# Patient Record
Sex: Female | Born: 2000 | Marital: Single
Health system: Midwestern US, Community
[De-identification: ages and names within clinical notes are randomized; demographics above are authoritative.]

## PROBLEM LIST (undated history)

## (undated) DIAGNOSIS — F419 Anxiety disorder, unspecified: Secondary | ICD-10-CM

## (undated) DIAGNOSIS — F32A Depression, unspecified: Secondary | ICD-10-CM

## (undated) DIAGNOSIS — R109 Unspecified abdominal pain: Secondary | ICD-10-CM

## (undated) DIAGNOSIS — J45909 Unspecified asthma, uncomplicated: Secondary | ICD-10-CM

## (undated) HISTORY — DX: Unspecified abdominal pain: R10.9

---
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
## 2023-06-11 ENCOUNTER — Other Ambulatory Visit: Payer: Self-pay

## 2023-06-11 ENCOUNTER — Ambulatory Visit
Admission: EM | Admit: 2023-06-11 | Discharge: 2023-06-11 | Disposition: A | Discharge status: Home / Self Care | Payer: Medicaid Other | Attending: Physician Assistant | Admitting: Physician Assistant

## 2023-06-11 DIAGNOSIS — W57XXXA Bitten or stung by nonvenomous insect and other nonvenomous arthropods, initial encounter: Secondary | ICD-10-CM | POA: Diagnosis not present

## 2023-06-11 DIAGNOSIS — S80869A Insect bite (nonvenomous), unspecified lower leg, initial encounter: Secondary | ICD-10-CM | POA: Diagnosis not present

## 2023-06-11 MED ORDER — TRIAMCINOLONE ACETONIDE 0.1 % EX CREA: 1.0000 | TOPICAL_CREAM | Freq: Two times a day (BID) | CUTANEOUS | 0 refills | Status: DC

## 2023-06-11 NOTE — ED Triage Notes (Signed)
"  I think I am having and allergic reaction to mosquito bites". Redness, swelling, itching in various areas.  No respiratory distress. No (hives). No scratchy/itchy throat. No sob. No chest pain.

## 2023-06-11 NOTE — ED Provider Notes (Signed)
[facility]    CSN: [id number] Arrival date & time: 06/11/23  1747      History   Chief Complaint Chief Complaint  Patient presents with   Insect Bite    Mosquito-Numerous    HPI Melinda Shea is a 22 y.o. female.   Patient here today for evaluation of possible allergic reaction to mosquito bites.  She states that she has had increased erythema around known mosquito bites and these areas are itchy.  She has tried topical treatment without significant improvement.  She has not had a topical steroid.  She denies any fever.  She has not any shortness of breath or trouble swallowing.  Bites are present to both lower legs as well as rare on her arms.  The history is provided by the patient.    Past Medical History:  Diagnosis Date   Abdominal pain    Asthma     Patient Active Problem List   Diagnosis Date Noted   Abdominal pain     History reviewed. No pertinent surgical history.  OB History   No obstetric history on file.      Home Medications    Prior to Admission medications   Medication Sig Start Date End Date Taking? Authorizing Provider  triamcinolone cream (KENALOG) 0.1 % Apply 1 Application topically 2 (two) times daily. 06/11/23  Yes Tomi Bamberger, PA-C  ciprofloxacin-hydrocortisone (CIPRO HC) OTIC suspension Place 3 drops into the left ear 2 (two) times daily. X 7 days 07/11/21   Domenick Gong, MD  cyclobenzaprine (FLEXERIL) 10 MG tablet Take 1 tablet (10 mg total) by mouth at bedtime. 07/11/21   Domenick Gong, MD  fluticasone (FLONASE) 50 MCG/ACT nasal spray Place 2 sprays into both nostrils daily. 01/31/18   Domenick Gong, MD  mupirocin ointment (BACTROBAN) 2 % Apply 1 application topically 2 (two) times daily. 07/11/21   Domenick Gong, MD  naproxen (NAPROSYN) 500 MG tablet Take 1 tablet (500 mg total) by mouth 2 (two) times daily. 07/11/21   Domenick Gong, MD    Family History Family History  Problem Relation Age of  Onset   Diabetes Mother    Hypertension Mother     Social History Social History   Tobacco Use   Smoking status: Never   Smokeless tobacco: Never  Vaping Use   Vaping status: Never Used  Substance Use Topics   Alcohol use: No   Drug use: No     Allergies   Patient has no known allergies.   Review of Systems Review of Systems  Constitutional:  Negative for chills and fever.  HENT:  Negative for trouble swallowing.   Eyes:  Negative for discharge and redness.  Respiratory:  Negative for shortness of breath.   Cardiovascular:  Negative for chest pain.  Gastrointestinal:  Negative for nausea and vomiting.  Skin:  Positive for color change. Negative for wound.     Physical Exam Triage Vital Signs ED Triage Vitals  Encounter Vitals Group     BP 06/11/23 1806 113/79     Systolic BP Percentile --      Diastolic BP Percentile --      Pulse Rate 06/11/23 1806 86     Resp 06/11/23 1806 16     Temp 06/11/23 1806 98.1 F (36.7 C)     Temp Source 06/11/23 1806 Oral     SpO2 06/11/23 1806 97 %     Weight 06/11/23 1804 155 lb (70.3 kg)  Height 06/11/23 1804 5\' 10"  (1.778 m)     Head Circumference --      Peak Flow --      Pain Score 06/11/23 1804 0     Pain Loc --      Pain Education --      Exclude from Growth Chart --    No data found.  Updated Vital Signs BP 113/79 (BP Location: Right Arm)   Pulse 86   Temp 98.1 F (36.7 C) (Oral)   Resp 16   Ht 5\' 10"  (1.778 m)   Wt 155 lb (70.3 kg)   LMP 05/17/2023 (Exact Date)   SpO2 97%   BMI 22.24 kg/m      Physical Exam Vitals and nursing note reviewed.  Constitutional:      General: She is not in acute distress.    Appearance: Normal appearance. She is not ill-appearing.  HENT:     Head: Normocephalic and atraumatic.  Eyes:     Conjunctiva/sclera: Conjunctivae normal.  Cardiovascular:     Rate and Rhythm: Normal rate.  Pulmonary:     Effort: Pulmonary effort is normal. No respiratory distress.   Skin:    Comments: Scattered erythematous papules to bilateral lower legs  Neurological:     Mental Status: She is alert.  Psychiatric:        Mood and Affect: Mood normal.        Behavior: Behavior normal.        Thought Content: Thought content normal.      UC Treatments / Results  Labs (all labs ordered are listed, but only abnormal results are displayed) Labs Reviewed - No data to display  EKG   Radiology No results found.  Procedures Procedures (including critical care time)  Medications Ordered in UC Medications - No data to display  Initial Impression / Assessment and Plan / UC Course  I have reviewed the triage vital signs and the nursing notes.  Pertinent labs & imaging results that were available during my care of the patient were reviewed by me and considered in my medical decision making (see chart for details).    Patient reports she is confident she was bitten by mosquitoes.  Discussed treatment with topical steroid cream in hopes to improve symptoms and encouraged follow-up if no gradual improvement.  Patient expressed understanding.  Final Clinical Impressions(s) / UC Diagnoses   Final diagnoses:  Insect bite of lower leg, unspecified laterality, initial encounter   Discharge Instructions   None    ED Prescriptions     Medication Sig Dispense Auth. Provider   triamcinolone cream (KENALOG) 0.1 % Apply 1 Application topically 2 (two) times daily. 30 g Tomi Bamberger, PA-C      PDMP not reviewed this encounter.   Tomi Bamberger, PA-C 06/11/23 1940

## 2023-12-25 DIAGNOSIS — Z0183 Encounter for blood typing: Secondary | ICD-10-CM | POA: Diagnosis not present

## 2023-12-26 ENCOUNTER — Ambulatory Visit (INDEPENDENT_AMBULATORY_CARE_PROVIDER_SITE_OTHER): Payer: Medicaid Other | Admitting: Family Medicine

## 2023-12-26 VITALS — BP 106/73 | HR 84 | Ht 70.0 in | Wt 153.2 lb

## 2023-12-26 DIAGNOSIS — F419 Anxiety disorder, unspecified: Secondary | ICD-10-CM

## 2023-12-26 DIAGNOSIS — Z1341 Encounter for autism screening: Secondary | ICD-10-CM | POA: Diagnosis not present

## 2023-12-26 DIAGNOSIS — L700 Acne vulgaris: Secondary | ICD-10-CM

## 2023-12-26 DIAGNOSIS — F332 Major depressive disorder, recurrent severe without psychotic features: Secondary | ICD-10-CM

## 2023-12-26 DIAGNOSIS — L709 Acne, unspecified: Secondary | ICD-10-CM | POA: Insufficient documentation

## 2023-12-26 DIAGNOSIS — Z7689 Persons encountering health services in other specified circumstances: Secondary | ICD-10-CM

## 2023-12-26 MED ORDER — DESVENLAFAXINE SUCCINATE ER 50 MG PO TB24: 50.0000 mg | ORAL_TABLET | Freq: Every day | ORAL | 2 refills | Status: DC

## 2023-12-26 NOTE — Progress Notes (Signed)
New Patient Office Visit  Subjective   Patient ID: Melinda Shea, female    DOB: 2001/03/01  Age: 23 y.o. MRN: [id number]  CC:  Chief Complaint  Patient presents with   Establish Care    HPI Melinda Shea presents to establish care. Past medical history includes depression and anxiety.  She is currently not taking medication for it.  She did previously take medication and was seeing somebody for mental health and is interested in reestablishing mental health care.  She has tried Prozac in the past as well as Zoloft, neither were effective for her depression.  She endorses some suicidal ideation, specifying that it is the feeling of not wanting to exist, not feeling the desire to hurt or kill herself.  Denies plans to hurt herself or other people.  She would like a referral to psychiatry as well as a therapist.  She is also requesting a referral for an autism evaluation.  She has struggled with acne for many years now.  Previous provider has tried several topical medications that have offered minimal improvement.  She is interested in discussing and potentially starting Accutane.    12/26/2023    2:19 PM  Depression screen PHQ 2/9  Decreased Interest 3  Down, Depressed, Hopeless 3  PHQ - 2 Score 6  Altered sleeping 3  Tired, decreased energy 3  Change in appetite 3  Feeling bad or failure about yourself  3  Trouble concentrating 3  Moving slowly or fidgety/restless 1  Suicidal thoughts 1  PHQ-9 Score 23  Difficult doing work/chores Very difficult      12/26/2023    2:19 PM  GAD 7 : Generalized Anxiety Score  Nervous, Anxious, on Edge 3  Control/stop worrying 2  Worry too much - different things 3  Trouble relaxing 2  Restless 1  Easily annoyed or irritable 2  Afraid - awful might happen 2  Total GAD 7 Score 15  Anxiety Difficulty Very difficult      Outpatient Encounter Medications as of 12/26/2023  Medication Sig   desvenlafaxine (PRISTIQ) 50 MG 24 hr tablet Take  1 tablet (50 mg total) by mouth daily.   fexofenadine (ALLEGRA) 180 MG tablet Take 180 mg by mouth daily.   VENTOLIN HFA 108 (90 Base) MCG/ACT inhaler Inhale 2 puffs into the lungs every 6 (six) hours as needed.   [DISCONTINUED] ciprofloxacin-hydrocortisone (CIPRO HC) OTIC suspension Place 3 drops into the left ear 2 (two) times daily. X 7 days   [DISCONTINUED] cyclobenzaprine (FLEXERIL) 10 MG tablet Take 1 tablet (10 mg total) by mouth at bedtime.   [DISCONTINUED] fluticasone (FLONASE) 50 MCG/ACT nasal spray Place 2 sprays into both nostrils daily.   [DISCONTINUED] mupirocin ointment (BACTROBAN) 2 % Apply 1 application topically 2 (two) times daily.   [DISCONTINUED] naproxen (NAPROSYN) 500 MG tablet Take 1 tablet (500 mg total) by mouth 2 (two) times daily.   [DISCONTINUED] triamcinolone cream (KENALOG) 0.1 % Apply 1 Application topically 2 (two) times daily.   No facility-administered encounter medications on file as of 12/26/2023.    Past Medical History:  Diagnosis Date   Abdominal pain    Asthma     History reviewed. No pertinent surgical history.  Family History  Problem Relation Age of Onset   Diabetes Mother    Hypertension Mother     Social History   Socioeconomic History   Marital status: Single    Spouse name: Not on file   Number of children: Not  on file   Years of education: Not on file   Highest education level: Some college, no degree  Occupational History   Not on file  Tobacco Use   Smoking status: Never   Smokeless tobacco: Never  Vaping Use   Vaping status: Never Used  Substance and Sexual Activity   Alcohol use: No   Drug use: No   Sexual activity: Not Currently  Other Topics Concern   Not on file  Social History Narrative   Not on file   Social Drivers of Health   Financial Resource Strain: Low Risk  (12/26/2023)   Overall Financial Resource Strain (CARDIA)    Difficulty of Paying Living Expenses: Not hard at all  Food Insecurity: No Food  Insecurity (12/26/2023)   Hunger Vital Sign    Worried About Running Out of Food in the Last Year: Never true    Ran Out of Food in the Last Year: Never true  Transportation Needs: No Transportation Needs (12/26/2023)   PRAPARE - Administrator, Civil Service (Medical): No    Lack of Transportation (Non-Medical): No  Physical Activity: Sufficiently Active (12/26/2023)   Exercise Vital Sign    Days of Exercise per Week: 6 days    Minutes of Exercise per Session: 90 min  Stress: Stress Concern Present (12/26/2023)   [employer] of Occupational Health - Occupational Stress Questionnaire    Feeling of Stress : Very much  Social Connections: Moderately Integrated (12/26/2023)   Social Connection and Isolation Panel [NHANES]    Frequency of Communication with Friends and Family: More than three times a week    Frequency of Social Gatherings with Friends and Family: More than three times a week    Attends Religious Services: 1 to 4 times per year    Active Member of [employer] or Organizations: Yes    Attends Engineer, structural: More than 4 times per year    Marital Status: Never married  Intimate Partner Violence: Not on file    Review of Systems  Constitutional:  Negative for malaise/fatigue and weight loss.  HENT:  Negative for congestion and ear pain.   Respiratory:  Negative for cough, shortness of breath and wheezing.   Cardiovascular:  Negative for chest pain and palpitations.  Gastrointestinal:  Negative for abdominal pain.  Skin:        Acne  Neurological:  Negative for dizziness and headaches.  Psychiatric/Behavioral:  Positive for depression and suicidal ideas. The patient is nervous/anxious.    Objective   BP 106/73   Pulse 84   Ht 5\' 10"  (1.778 m)   Wt 153 lb 4 oz (69.5 kg)   LMP 12/23/2023   SpO2 98%   BMI 21.99 kg/m   Physical Exam Constitutional:      General: She is not in acute distress.    Appearance: Normal appearance. She is not  ill-appearing.  HENT:     Head: Normocephalic and atraumatic.     Right Ear: Tympanic membrane, ear canal and external ear normal.     Left Ear: Tympanic membrane, ear canal and external ear normal.     Nose: Nose normal. No congestion or rhinorrhea.     Mouth/Throat:     Mouth: Mucous membranes are moist.     Pharynx: Oropharynx is clear. No oropharyngeal exudate or posterior oropharyngeal erythema.  Eyes:     General:        Right eye: No discharge.  Left eye: No discharge.     Conjunctiva/sclera: Conjunctivae normal.     Comments: Wearing glasses  Cardiovascular:     Rate and Rhythm: Normal rate and regular rhythm.     Heart sounds: No murmur heard.    No friction rub. No gallop.  Pulmonary:     Effort: Pulmonary effort is normal. No respiratory distress.     Breath sounds: Normal breath sounds. No wheezing, rhonchi or rales.  Skin:    General: Skin is warm and dry.  Neurological:     Mental Status: She is alert and oriented to person, place, and time.      Assessment & Plan:  Encounter to establish care  Anxiety Assessment & Plan: PHQ-9 score 23, GAD-7 score 15.  Patient has tried and failed 2 SSRIs, initiate trial of SNRI desvenlafaxine 50 mg daily.  At follow-up, consider increasing dose or augmenting with atypical antipsychotic given history of severe depression with SI.  Referral placed to [facility] for both psychiatry and therapy.  Orders: -     Desvenlafaxine Succinate ER; Take 1 tablet (50 mg total) by mouth daily.  Dispense: 30 tablet; Refill: 2 -     Ambulatory referral to Psychiatry  Severe episode of recurrent major depressive disorder, without psychotic features (HCC) Assessment & Plan: PHQ-9 score 23, GAD-7 score 15.  Patient has tried and failed 2 SSRIs, initiate trial of SNRI desvenlafaxine 50 mg daily.  At follow-up, consider increasing dose or augmenting with atypical antipsychotic given history of severe depression with  SI.  Referral placed to [facility] for both psychiatry and therapy.  Orders: -     Desvenlafaxine Succinate ER; Take 1 tablet (50 mg total) by mouth daily.  Dispense: 30 tablet; Refill: 2 -     Ambulatory referral to Psychiatry  Encounter for autism screening -     Ambulatory referral to Psychology  Acne vulgaris Assessment & Plan: Previously tried and failed several topical medications.  She is interested in starting Accutane, we discussed that there are particular steps that must be followed given its high risk of teratogenicity.  Due to its risk of worsening mental health and suicidal ideation, waiting for mental health to be in a better place before starting Accutane.  Patient verbalized understanding and is agreeable to this plan.     Return in about 6 weeks (around 02/06/2024) for follow-up for mood.   Melida Quitter, PA

## 2023-12-26 NOTE — Assessment & Plan Note (Signed)
PHQ-9 score 23, GAD-7 score 15.  Patient has tried and failed 2 SSRIs, initiate trial of SNRI desvenlafaxine 50 mg daily.  At follow-up, consider increasing dose or augmenting with atypical antipsychotic given history of severe depression with SI.  Referral placed to [facility] for both psychiatry and therapy.

## 2023-12-26 NOTE — Assessment & Plan Note (Signed)
Previously tried and failed several topical medications.  She is interested in starting Accutane, we discussed that there are particular steps that must be followed given its high risk of teratogenicity.  Due to its risk of worsening mental health and suicidal ideation, waiting for mental health to be in a better place before starting Accutane.  Patient verbalized understanding and is agreeable to this plan.

## 2023-12-26 NOTE — Patient Instructions (Addendum)
REFERRALS: -Autism evaluation [facility] at [facility] [phone] [address] [place], [place] [address] -Psychiatry and therapist (both at the same location!) [facility] [phone] [address] [place], [place] [address] MEDICATION: -desvenlafaxine 50 mg once daily. I recommend starting by taking it in the morning, but you can change to nighttime if it makes you sleepy.  ACCUTANE: -Since Accutane can worsen mental health, I recommend waiting to start Accutane until you feel that your mental health is is a good place!

## 2024-02-06 ENCOUNTER — Ambulatory Visit (INDEPENDENT_AMBULATORY_CARE_PROVIDER_SITE_OTHER): Payer: Medicaid Other | Admitting: Family Medicine

## 2024-02-06 ENCOUNTER — Encounter: Payer: Self-pay | Admitting: Family Medicine

## 2024-02-06 VITALS — BP 116/81 | HR 100 | Ht 70.0 in | Wt 149.4 lb

## 2024-02-06 DIAGNOSIS — F324 Major depressive disorder, single episode, in partial remission: Secondary | ICD-10-CM | POA: Diagnosis not present

## 2024-02-06 DIAGNOSIS — L700 Acne vulgaris: Secondary | ICD-10-CM | POA: Diagnosis not present

## 2024-02-06 MED ORDER — DOXYCYCLINE HYCLATE 100 MG PO TABS: ORAL_TABLET | ORAL | 1 refills | Status: DC

## 2024-02-06 MED ORDER — ADAPALENE-BENZOYL PEROXIDE 0.1-2.5 % EX GEL: 1.0000 | Freq: Every day | CUTANEOUS | 5 refills | Status: DC

## 2024-02-06 NOTE — Assessment & Plan Note (Signed)
 Discussed with the relative contraindication to Accutane with her recent diagnosis of depression.  Will prescribe oral doxycycline 100 mg daily and topical benzoyl-adapalene.  Follow-up 1 month.

## 2024-02-06 NOTE — Progress Notes (Signed)
   Established Patient Office Visit  Subjective   Patient ID: Melinda Shea, female    DOB: 05-10-2001  Age: 23 y.o. MRN: [id number]  Chief Complaint  Patient presents with   Medical Management of Chronic Issues    HPI  Mood -patient states that she feels like the Pristiq is working well.  She does not want to adjust the medication at this time.  She states the provider and we referred her to did not offer therapy services.  They could offer genetic testing for medications to see which ones would be most effective for her.  We talked about finding a therapist and using [email or web address] to search local therapist online and reach out to them.  Acne-patient wanted to talk about acne and if we could start Accutane.  We discussed how Accutane has been associated with shooting with increased depression.  We discussed other methods including using oral doxycycline along with topical adapalene-benzoyl peroxide before we decide to try something stronger like Accutane   The ASCVD Risk score (Arnett DK, et al., 2019) failed to calculate for the following reasons:   The 2019 ASCVD risk score is only valid for ages 96 to 38  Health Maintenance Due  Topic Date Due   Pneumococcal Vaccine 47-25 Years old (1 of 2 - PCV) 09/14/2009   HIV Screening  Never done   Hepatitis C Screening  Never done   COVID-19 Vaccine (3 - 2024-25 season) 07/14/2023      Objective:     BP 116/81   Pulse 100   Ht 5\' 10"  (1.778 m)   Wt 149 lb 6.4 oz (67.8 kg)   LMP 01/27/2024   SpO2 98%   BMI 21.44 kg/m    Physical Exam General: Alert and oriented.  Right side pulmonary: No respiratory stress Psych: Pleasant affect, spontaneous speech, good eye contact. Skin: Close comedones on the face.  Picture in media tab.   No results found for any visits on 02/06/24.      Assessment & Plan:   Acne vulgaris Assessment & Plan: Discussed with the relative contraindication to Accutane with her recent diagnosis  of depression.  Will prescribe oral doxycycline 100 mg daily and topical benzoyl-adapalene.  Follow-up 1 month.  Orders: -     Ambulatory referral to Dermatology  Major depressive disorder in partial remission, unspecified whether recurrent (HCC) Assessment & Plan: Improved mood with Pristiq.Marland Kitchen  Continue current dose.     02/06/2024    1:59 PM 12/26/2023    2:19 PM  PHQ9 SCORE ONLY  PHQ-9 Total Score 9 23      Other orders -     Adapalene-Benzoyl Peroxide; Apply 1 Application topically at bedtime.  Dispense: 45 g; Refill: 5 -     Doxycycline Hyclate; 1 tablet twice a day for day 1.  1 tablet daily thereafter  Dispense: 31 tablet; Refill: 1     Return in about 4 weeks (around 03/05/2024) for Acne, mood.    Sandre Kitty, MD

## 2024-02-06 NOTE — Patient Instructions (Addendum)
 It was nice to see you today,  We addressed the following topics today: We will not make any changes to your Pristiq. - I have prescribed some medications for your acne.  I would like you to do the following: 1.  Use the gel I have prescribed that contains benzyl peroxide and adapalene nightly before you apply the gel you should cleanse your face with a gentle daily cleanser such as Cetaphil.  Glad to come basically patient initially okay then 's online 2.  Take doxycycline daily.  On the first day take 1 tablet twice a day, thereafter you should take 1 tablet daily. - Follow-up with me in 1 month. - I have also sent in a referral to dermatology but if you feel like you are acne has improved significantly with the above treatments we can cancel the referral.  Have a great day,  Frederic Jericho, MD

## 2024-02-06 NOTE — Assessment & Plan Note (Signed)
 Improved mood with Pristiq.Marland Kitchen  Continue current dose.     02/06/2024    1:59 PM 12/26/2023    2:19 PM  PHQ9 SCORE ONLY  PHQ-9 Total Score 9 23

## 2024-03-10 DIAGNOSIS — Z23 Encounter for immunization: Secondary | ICD-10-CM | POA: Diagnosis not present

## 2024-03-12 ENCOUNTER — Ambulatory Visit: Admitting: Family Medicine

## 2024-03-12 ENCOUNTER — Encounter: Payer: Self-pay | Admitting: Family Medicine

## 2024-03-12 VITALS — BP 97/66 | HR 82 | Ht 70.0 in | Wt 148.0 lb

## 2024-03-12 DIAGNOSIS — Z23 Encounter for immunization: Secondary | ICD-10-CM | POA: Diagnosis not present

## 2024-03-12 DIAGNOSIS — F324 Major depressive disorder, single episode, in partial remission: Secondary | ICD-10-CM | POA: Diagnosis not present

## 2024-03-12 DIAGNOSIS — L700 Acne vulgaris: Secondary | ICD-10-CM | POA: Diagnosis not present

## 2024-03-12 MED ORDER — TRETINOIN 0.025 % EX GEL: Freq: Every day | CUTANEOUS | 2 refills | Status: DC

## 2024-03-12 MED ORDER — CLINDAMYCIN PHOS-BENZOYL PEROX 1.2-5 % EX GEL: 1.0000 "application " | Freq: Every day | CUTANEOUS | 2 refills | Status: DC

## 2024-03-12 NOTE — Patient Instructions (Addendum)
 It was nice to see you today,  We addressed the following topics today: -I would like you to stop taking the cream and the oral pill that you are taking now for your acne. - Instead I would like you to take Retin-A  cream in the morning and Duac (benzoyl-clindamycin ) cream in the evening. - We we will find out what happened with the dermatology referral - If you schedule your dermatology and your psychiatry appointment then you can just reschedule your next appointment with me to a later date.   Have a great day,  Etha Henle, MD

## 2024-03-12 NOTE — Assessment & Plan Note (Addendum)
 Continue prestiq.  No changes.  Has psych appt scheduled next month

## 2024-03-12 NOTE — Assessment & Plan Note (Signed)
 Minimal improvement after oral doxycycline , adapaline-benzoyl peroxide  topical.  Will switch to retin-a  and duac.  Has not been contacted about appt yet.

## 2024-03-12 NOTE — Progress Notes (Signed)
   Established Patient Office Visit  Subjective   Patient ID: Melinda Shea, female    DOB: 22-Nov-2000  Age: 23 y.o. MRN: [id number]  Chief Complaint  Patient presents with   Medical Management of Chronic Issues    HPI  Subjective: - Acne: Initially improved with treatment but then started breaking out again. Cheek area improved, but chin, upper lip, and forehead still have issues. Forehead has "little bumps" that patient wonders if could be fungal. - Current treatment: Doxycycline  daily, benzoyl peroxide  cream once daily, facial cleanser. Evening routine: cleanser, gel, moisturizer. Morning routine: cleanser, moisturizer, sunscreen.  - Mental health: Has upcoming appointment with [facility] for anxiety/depression evaluation.  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   The ASCVD Risk score (Arnett DK, et al., 2019) failed to calculate for the following reasons:   The 2019 ASCVD risk score is only valid for ages 53 to 26  Health Maintenance Due  Topic Date Due   Pneumococcal Vaccine 44-73 Years old (1 of 1 - PPSV23) 09/14/2009   HIV Screening  Never done   Meningococcal B Vaccine (1 of 2 - Standard) Never done   Hepatitis C Screening  Never done   COVID-19 Vaccine (3 - 2024-25 season) 07/14/2023      Objective:     BP 97/66   Pulse 82   Ht 5\' 10"  (1.778 m)   Wt 148 lb (67.1 kg)   LMP 02/20/2024   SpO2 99%   BMI 21.24 kg/m    Physical Exam Gen: alert, oriented Skin: mild/mod acne on the face.  Improved on the cheeks.  Same as previous visit on forehead   No results found for any visits on 03/12/24.      Assessment & Plan:   Acne vulgaris Assessment & Plan: Minimal improvement after oral doxycycline , adapaline-benzoyl peroxide  topical.  Will switch to retin-a  and duac.  Has not been contacted about appt yet.     Need for Menactra vaccination -     Meningococcal conjugate vaccine  4-valent IM  Major depressive disorder in partial remission, unspecified whether recurrent (HCC) Assessment & Plan: Continue prestiq.  No changes.  Has psych appt scheduled next month   Other orders -     Clindamycin  Phos-Benzoyl Perox; Apply 1 application  topically daily.  Dispense: 45 g; Refill: 2 -     Tretinoin ; Apply topically at bedtime.  Dispense: 45 g; Refill: 2     Return in about 3 months (around 06/12/2024) for acne, mood.    Laneta Pintos, MD

## 2024-03-13 ENCOUNTER — Telehealth: Payer: Self-pay | Admitting: *Deleted

## 2024-03-13 NOTE — Telephone Encounter (Signed)
 Copied from CRM [id number]. Topic: General - Other >> Mar 13, 2024 11:17 AM Melinda Shea wrote: Reason for CRM: Att: Odilia Bennett- patient returning missed phone call regarding dermatology referral. Patient request a callback

## 2024-03-20 DIAGNOSIS — Z5181 Encounter for therapeutic drug level monitoring: Secondary | ICD-10-CM | POA: Diagnosis not present

## 2024-03-20 DIAGNOSIS — Z79899 Other long term (current) drug therapy: Secondary | ICD-10-CM | POA: Diagnosis not present

## 2024-03-20 DIAGNOSIS — F411 Generalized anxiety disorder: Secondary | ICD-10-CM | POA: Diagnosis not present

## 2024-03-20 DIAGNOSIS — F331 Major depressive disorder, recurrent, moderate: Secondary | ICD-10-CM | POA: Diagnosis not present

## 2024-06-09 DIAGNOSIS — F331 Major depressive disorder, recurrent, moderate: Secondary | ICD-10-CM | POA: Diagnosis not present

## 2024-06-09 DIAGNOSIS — F411 Generalized anxiety disorder: Secondary | ICD-10-CM | POA: Diagnosis not present

## 2024-06-19 ENCOUNTER — Ambulatory Visit: Admitting: Family Medicine

## 2024-07-10 DIAGNOSIS — F331 Major depressive disorder, recurrent, moderate: Secondary | ICD-10-CM | POA: Diagnosis not present

## 2024-07-10 DIAGNOSIS — F411 Generalized anxiety disorder: Secondary | ICD-10-CM | POA: Diagnosis not present

## 2024-08-07 ENCOUNTER — Encounter: Payer: Self-pay | Admitting: Family Medicine

## 2024-08-07 ENCOUNTER — Ambulatory Visit (INDEPENDENT_AMBULATORY_CARE_PROVIDER_SITE_OTHER): Admitting: Family Medicine

## 2024-08-07 VITALS — BP 108/76 | HR 76 | Ht 70.0 in | Wt 155.0 lb

## 2024-08-07 DIAGNOSIS — F324 Major depressive disorder, single episode, in partial remission: Secondary | ICD-10-CM

## 2024-08-07 DIAGNOSIS — Z0000 Encounter for general adult medical examination without abnormal findings: Secondary | ICD-10-CM

## 2024-08-07 DIAGNOSIS — F332 Major depressive disorder, recurrent severe without psychotic features: Secondary | ICD-10-CM

## 2024-08-07 DIAGNOSIS — L700 Acne vulgaris: Secondary | ICD-10-CM

## 2024-08-07 DIAGNOSIS — F419 Anxiety disorder, unspecified: Secondary | ICD-10-CM | POA: Diagnosis not present

## 2024-08-07 NOTE — Patient Instructions (Signed)
 It was nice to see you today,  We addressed the following topics today: -I will fill out your form whenever I get it.  You can ask upfront if we have an email that you can use, otherwise you can use MyChart or bring it in to the office - We can schedule your physical for sometime after you get back next summer.   Have a great day,  Rolan Slain, MD

## 2024-08-07 NOTE — Progress Notes (Signed)
   Established Patient Office Visit  Subjective   Patient ID: Melinda Shea, female    DOB: 01-20-2001  Age: 23 y.o. MRN: [id number]  Chief Complaint  Patient presents with   Medical Management of Chronic Issues    HPI  Subjective - Follow-up on acne. Reports new medications (Retin-A  0.025 gel and Duac cream) are helping. Experienced some initial skin purging but no rash or other issues. - Discussed physician form for study abroad. Needs form completed for a 39-month program in [place], [place], and [place] starting in January. Plans to email or upload form via MyChart. - Inquires about timing for annual physical exam.  Medications Retin-A  0.025 gel, Duac cream for acne. Pristiq  75mg  managed by psychiatry.  PMH, PSH, FH, Social Hx PMHx: Acne. 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 Previously completed a 2-week study abroad in [place].  ROS Derm: Reports initial skin purging with new acne medication, now resolved. Denies rash. Psych: No new concerns reported.     The ASCVD Risk score (Arnett DK, et al., 2019) failed to calculate for the following reasons:   The 2019 ASCVD risk score is only valid for ages 81 to 85  Health Maintenance Due  Topic Date Due   Pneumococcal Vaccine (1 of 1 - PPSV23, PCV20, or PCV21) 09/14/2009   HIV Screening  Never done   Meningococcal B Vaccine (1 of 2 - Standard) Never done   Hepatitis C Screening  Never done   COVID-19 Vaccine (3 - Pfizer risk series) 07/23/2022   Influenza Vaccine  06/12/2024      Objective:     BP 108/76   Pulse 76   Ht 5' 10 (1.778 m)   Wt 155 lb (70.3 kg)   LMP 08/04/2024   SpO2 99%   BMI 22.24 kg/m    Physical Exam Gen: alert, oriented HEENT: perrla, eomi, mmm CV: rrr, no murmur Pulm: lctab. No wheeze or crackles.  GI: soft, nbs.  Nontender to palpation MSK: strength equal b/l. Normal  gait Ext: no pedal edema Skin: warm and dry, no rashes Psych: pleasant affect.  Spontaneous speech    No results found for any visits on 08/07/24.      Assessment & Plan:   Acne vulgaris Assessment & Plan: Follow-up for acne management. Reports improvement with Retin-A  gel and Duac cream, with some initial, now resolved, skin purging. - Continue current topical medications. No refills needed at this time.   Healthcare maintenance Assessment & Plan: Patient is due for an annual physical exam. They will be out of the country from January to May for a study abroad program. - Plan for a physical exam after return in the summer. - Will complete pre-travel physician forms upon receipt. Advised patient on how to submit the forms.   Major depressive disorder in partial remission, unspecified whether recurrent Assessment & Plan: Continue prestiq 75mg .  Prescribed by psychiatry      Return in about 9 months (around 05/07/2025) for physical.    Toribio MARLA Slain, MD

## 2024-08-08 DIAGNOSIS — Z0000 Encounter for general adult medical examination without abnormal findings: Secondary | ICD-10-CM | POA: Insufficient documentation

## 2024-08-08 MED ORDER — DESVENLAFAXINE SUCCINATE ER 25 MG PO TB24: 75.0000 mg | ORAL_TABLET | Freq: Every day | ORAL | Status: AC

## 2024-08-08 NOTE — Assessment & Plan Note (Signed)
 Patient is due for an annual physical exam. They will be out of the country from January to May for a study abroad program. - Plan for a physical exam after return in the summer. - Will complete pre-travel physician forms upon receipt. Advised patient on how to submit the forms.

## 2024-08-08 NOTE — Assessment & Plan Note (Signed)
 Continue prestiq 75mg .  Prescribed by psychiatry

## 2024-08-08 NOTE — Assessment & Plan Note (Signed)
 Follow-up for acne management. Reports improvement with Retin-A  gel and Duac cream, with some initial, now resolved, skin purging. - Continue current topical medications. No refills needed at this time.

## 2024-08-09 ENCOUNTER — Encounter: Payer: Self-pay | Admitting: Family Medicine

## 2024-08-10 DIAGNOSIS — F331 Major depressive disorder, recurrent, moderate: Secondary | ICD-10-CM | POA: Diagnosis not present

## 2024-08-10 DIAGNOSIS — F411 Generalized anxiety disorder: Secondary | ICD-10-CM | POA: Diagnosis not present

## 2024-09-29 ENCOUNTER — Ambulatory Visit: Admitting: Dermatology

## 2024-09-29 ENCOUNTER — Encounter: Payer: Self-pay | Admitting: Dermatology

## 2024-09-29 VITALS — BP 110/72 | HR 101

## 2024-09-29 DIAGNOSIS — L700 Acne vulgaris: Secondary | ICD-10-CM | POA: Diagnosis not present

## 2024-09-29 MED ORDER — SPIRONOLACTONE 100 MG PO TABS: 100.0000 mg | ORAL_TABLET | Freq: Every day | ORAL | 9 refills | Status: DC

## 2024-09-29 NOTE — Patient Instructions (Addendum)
 VISIT SUMMARY:  Today, we discussed your ongoing acne treatment. You have been using topical treatments with some improvement on your cheeks, but your forehead remains problematic. We reviewed your skincare routine and made some adjustments to enhance your treatment.  YOUR PLAN:  -ACNE VULGARIS:  Acne vulgaris is a common skin condition that occurs when hair follicles become clogged with oil and dead skin cells, leading to pimples, blackheads, and whiteheads.   You will continue using tretinoin  every night, but you can adjust to every other night if you experience severe dryness or irritation.   Continue using clindamycin  every morning. We have added a salicylic acid wash for morning use to help exfoliate and reduce oiliness.   You have been prescribed spironolactone 100mg  to take at dinnertime to help prevent hormonal flares.   Please monitor for any side effects such as lightheadedness or changes in your menstrual cycle.   Additionally, you have been provided with a sample of Avene nighttime moisturizer to help with any dryness from tretinoin . For sunscreen, we recommend using Centella sunscreen daily, ensuring your skin is hydrated before application.  INSTRUCTIONS:  Please follow up with us  if you experience any side effects from the new medication or if your symptoms do not improve. Continue with your current skincare routine and the new adjustments we discussed. If you have any questions or concerns, do not hesitate to contact our office.     Important Information   Due to recent changes in healthcare laws, you may see results of your pathology and/or laboratory studies on MyChart before the doctors have had a chance to review them. We understand that in some cases there may be results that are confusing or concerning to you. Please understand that not all results are received at the same time and often the doctors may need to interpret multiple results in order to provide you  with the best plan of care or course of treatment. Therefore, we ask that you please give us  2 business days to thoroughly review all your results before contacting the office for clarification. Should we see a critical lab result, you will be contacted sooner.     If You Need Anything After Your Visit   If you have any questions or concerns for your doctor, please call our main line at [phone]. If no one answers, please leave a voicemail as directed and we will return your call as soon as possible. Messages left after 4 pm will be answered the following business day.    You may also send us  a message via MyChart. We typically respond to MyChart messages within 1-2 business days.  For prescription refills, please ask your pharmacy to contact our office. Our fax number is [phone].  If you have an urgent issue when the clinic is closed that cannot wait until the next business day, you can page your doctor at the number below.     Please note that while we do our best to be available for urgent issues outside of office hours, we are not available 24/7.    If you have an urgent issue and are unable to reach us , you may choose to seek medical care at your doctor's office, retail clinic, urgent care center, or emergency room.   If you have a medical emergency, please immediately call 911 or go to the emergency department. In the event of inclement weather, please call our main line at [phone] for an update on the status of any delays or  closures.  Dermatology Medication Tips: Please keep the boxes that topical medications come in in order to help keep track of the instructions about where and how to use these. Pharmacies typically print the medication instructions only on the boxes and not directly on the medication tubes.   If your medication is too expensive, please contact our office at [phone] or send us  a message through MyChart.    We are unable to tell what your co-pay  for medications will be in advance as this is different depending on your insurance coverage. However, we may be able to find a substitute medication at lower cost or fill out paperwork to get insurance to cover a needed medication.    If a prior authorization is required to get your medication covered by your insurance company, please allow us  1-2 business days to complete this process.   Drug prices often vary depending on where the prescription is filled and some pharmacies may offer cheaper prices.   The website www.goodrx.com contains coupons for medications through different pharmacies. The prices here do not account for what the cost may be with help from insurance (it may be cheaper with your insurance), but the website can give you the price if you did not use any insurance.  - You can print the associated coupon and take it with your prescription to the pharmacy.  - You may also stop by our office during regular business hours and pick up a GoodRx coupon card.  - If you need your prescription sent electronically to a different pharmacy, notify our office through [facility] or by phone at [phone]

## 2024-09-29 NOTE — Progress Notes (Signed)
   New Patient Visit   Subjective  Melinda Shea is a 23 y.o. female who presents for the following: Acne  Patient states she has acne located at the face that she would like to have examined. Patient reports the areas have been there for several years. She reports the areas are bothersome.Patient rates irritation 4 out of 10. Patient reports she has previously been treated for these areas. She is currently prescribed Clindamycin /BPO gel, Tretinoin  0.025%. Currently she is washing 2 times daily with a gentle cleanser (Cetaphil).  The following portions of the chart were reviewed this encounter and updated as appropriate: medications, allergies, medical history  Review of Systems:  No other skin or systemic complaints except as noted in HPI or Assessment and Plan.  Objective  Well appearing patient in no apparent distress; mood and affect are within normal limits.  A focused examination was performed of the following areas: Face  Relevant exam findings are noted in the Assessment and Plan.         Assessment & Plan   ACNE VULGARIS  Improvement on current topical regimen. Tretinoin  and clindamycin  have been used since May, with better results on cheeks and lower half of the face, but persistent issues on the forehead. Experiences flares around menstrual cycle and occasional itching, especially on the forehead. Interested in trying oral medication to enhance treatment efficacy. - Continue tretinoin  nightly, adjust to every other night if severe dryness or irritation occurs. - Continue clindamycin  every morning. - Added salicylic acid wash for morning use to exfoliate and reduce oiliness. - Prescribed spironolactone  to be taken at dinnertime to block hormone receptors and prevent flares. Monitor for side effects such as lightheadedness or changes in menstrual cycle. - Provided sample of Avene nighttime moisturizer to enhance tolerance to tretinoin . - Recommended Centella sunscreen  for daily use, ensuring skin is hydrated before application.  ACNE VULGARIS   Related Medications spironolactone  (ALDACTONE ) 100 MG tablet Take 1 tablet (100 mg total) by mouth daily.  Return in about 6 months (around 03/29/2025) for Acne F/U.  I, Jetta Ager, am acting as neurosurgeon for [employer], DO.  Documentation: I have reviewed the above documentation for accuracy and completeness, and I agree with the above.  Delon Lenis, DO

## 2024-09-30 ENCOUNTER — Telehealth: Payer: Self-pay | Admitting: Pharmacy Technician

## 2024-09-30 ENCOUNTER — Other Ambulatory Visit (HOSPITAL_COMMUNITY): Payer: Self-pay

## 2024-09-30 NOTE — Telephone Encounter (Signed)
 Pharmacy Patient Advocate Encounter   Received notification from Onbase that prior authorization for Tretinoin  0.025% cream is required/requested.   Insurance verification completed.   The patient is insured through [facility] MEDICAID.   Per test claim:  see below is preferred by the insurance.  If suggested medication is appropriate, Please send in a new RX and discontinue this one. If not, please advise as to why it's not appropriate so that we may request a Prior Authorization. Please note, some preferred medications may still require a PA.  If the suggested medications have not been trialed and there are no contraindications to their use, the PA will not be submitted, as it will not be approved.    Medicaid previously covered brand name Retin-A  cream. However, effective 08/12/2024 it has been removed from coverage. Generic tretinoin  cream is a non-preferred medication. Patient has to have a trial/failure or contraindication to 2 preferred medications before they can be approved for coverage for a non-preferred medication. Please advise.

## 2024-10-01 ENCOUNTER — Other Ambulatory Visit (HOSPITAL_COMMUNITY): Payer: Self-pay

## 2024-10-01 NOTE — Telephone Encounter (Signed)
 Pharmacy Patient Advocate Encounter   Received notification from Onbase that prior authorization for Tretinoin  0.025% cream is required/requested.   Insurance verification completed.   The patient is insured through [facility] MEDICAID.   Per test claim: PA required; PA submitted to above mentioned insurance via Latent Key/confirmation #/[id number] Status is pending

## 2024-10-01 NOTE — Telephone Encounter (Signed)
 She has a contraindication to accutane d/t diagnosis of depression.    She is also on clinda-benzoyl but this was not enough on its own.  She failed an oral antibiotic trial.  She failed adapaline-benzoyl combo.  The tretinoin  was working well for her

## 2024-10-01 NOTE — Telephone Encounter (Signed)
 Pharmacy Patient Advocate Encounter  Received notification from [facility] MEDICAID that Prior Authorization for Tretinoin  0.025% cream has been APPROVED from 10/01/2024 to 10/01/2025.   PA #/Case ID/Reference #: [id number]

## 2024-10-05 DIAGNOSIS — F331 Major depressive disorder, recurrent, moderate: Secondary | ICD-10-CM | POA: Diagnosis not present

## 2024-10-05 DIAGNOSIS — F411 Generalized anxiety disorder: Secondary | ICD-10-CM | POA: Diagnosis not present

## 2024-11-02 ENCOUNTER — Telehealth: Payer: Self-pay | Admitting: Dermatology

## 2024-11-02 ENCOUNTER — Other Ambulatory Visit: Payer: Self-pay | Admitting: Family Medicine

## 2024-11-02 DIAGNOSIS — L700 Acne vulgaris: Secondary | ICD-10-CM

## 2024-11-02 MED ORDER — TRETINOIN 0.025 % EX GEL: Freq: Every day | CUTANEOUS | 2 refills | Status: AC

## 2024-11-02 MED ORDER — CLINDAMYCIN PHOS-BENZOYL PEROX 1.2-5 % EX GEL: 1.0000 "application " | Freq: Every day | CUTANEOUS | 2 refills | Status: AC

## 2024-11-02 MED ORDER — SPIRONOLACTONE 100 MG PO TABS: 100.0000 mg | ORAL_TABLET | Freq: Every day | ORAL | 1 refills | Status: AC

## 2024-11-02 NOTE — Telephone Encounter (Signed)
 Copied from CRM [id number]. Topic: Clinical - Medication Refill >> Nov 02, 2024  1:59 PM Winona R wrote: Medication: Pt would like to know if she can receive 4 months of the following medication as she will be going out of the country ([place] to study abroad for about 4 months and will run out while she's there. pt is requesting the refill by Jan 3rd as she will be leaving town. Call back number is: [phone].  tretinoin  (RETIN-A ) 0.025 % gel  Clindamycin -Benzoyl Per, Refr, gel    Has the patient contacted their pharmacy? No (Agent: If no, request that the patient contact the pharmacy for the refill. If patient does not wish to contact the pharmacy document the reason why and proceed with request.) (Agent: If yes, when and what did the pharmacy advise?)  This is the patient's preferred pharmacy:  [facility] [address] [place], [place] - [address] [place] [address] Phone: [phone] Fax: [phone]  Is this the correct pharmacy for this prescription? Yes If no, delete pharmacy and type the correct one.   Has the prescription been filled recently? Yes  Is the patient out of the medication? No  Has the patient been seen for an appointment in the last year OR does the patient have an upcoming appointment? Yes  Can we respond through MyChart? Yes  Agent: Please be advised that Rx refills may take up to 3 business days. We ask that you follow-up with your pharmacy.

## 2024-11-02 NOTE — Telephone Encounter (Signed)
 Pt states she called earlier, but wanted to come in person too, she is requesting a 3 month supply for her spironolactone  (Aldactone ) 100 MG tablet. I did let her know Dr. Alm is out of the office for the next two weeks, however pt is requesting the refill by Jan 3rd as she will be leaving town. Call back number is: [phone].

## 2024-11-02 NOTE — Telephone Encounter (Signed)
 Pt has been informed that a 67mo supply was sent to the pharmacy on file.

## 2025-03-25 ENCOUNTER — Other Ambulatory Visit

## 2025-03-30 ENCOUNTER — Encounter: Admitting: Family Medicine

## 2025-04-06 ENCOUNTER — Ambulatory Visit: Admitting: Dermatology
